# Patient Record
Sex: Female | Born: 1965 | Race: White | Hispanic: No | Marital: Married
Health system: Southern US, Community
[De-identification: ages and names within clinical notes are randomized; demographics above are authoritative.]

---

## 2006-04-21 ENCOUNTER — Ambulatory Visit (HOSPITAL_COMMUNITY): Admission: RE | Admit: 2006-04-21 | Discharge: 2006-04-21 | Payer: Self-pay | Admitting: Orthopedic Surgery

## 2008-02-07 ENCOUNTER — Encounter: Admission: RE | Admit: 2008-02-07 | Discharge: 2008-02-07 | Payer: Self-pay | Admitting: Obstetrics and Gynecology

## 2010-05-25 ENCOUNTER — Other Ambulatory Visit: Payer: Self-pay | Admitting: Obstetrics and Gynecology

## 2010-05-25 DIAGNOSIS — Z1231 Encounter for screening mammogram for malignant neoplasm of breast: Secondary | ICD-10-CM

## 2010-06-09 ENCOUNTER — Ambulatory Visit
Admission: RE | Admit: 2010-06-09 | Discharge: 2010-06-09 | Disposition: A | Discharge status: Home / Self Care | Payer: BC Managed Care – PPO | Source: Ambulatory Visit | Attending: Obstetrics and Gynecology | Admitting: Obstetrics and Gynecology

## 2010-06-09 DIAGNOSIS — Z1231 Encounter for screening mammogram for malignant neoplasm of breast: Secondary | ICD-10-CM

## 2011-11-16 ENCOUNTER — Other Ambulatory Visit: Payer: Self-pay | Admitting: Unknown Physician Specialty

## 2011-11-16 DIAGNOSIS — Z1231 Encounter for screening mammogram for malignant neoplasm of breast: Secondary | ICD-10-CM

## 2011-11-17 ENCOUNTER — Ambulatory Visit
Admission: RE | Admit: 2011-11-17 | Discharge: 2011-11-17 | Disposition: A | Discharge status: Home / Self Care | Payer: BC Managed Care – PPO | Source: Ambulatory Visit | Attending: Unknown Physician Specialty | Admitting: Unknown Physician Specialty

## 2011-11-17 DIAGNOSIS — Z1231 Encounter for screening mammogram for malignant neoplasm of breast: Secondary | ICD-10-CM

## 2013-02-12 ENCOUNTER — Other Ambulatory Visit: Payer: Self-pay

## 2013-02-12 DIAGNOSIS — Z1231 Encounter for screening mammogram for malignant neoplasm of breast: Secondary | ICD-10-CM

## 2013-03-05 ENCOUNTER — Ambulatory Visit: Payer: BC Managed Care – PPO

## 2013-03-07 ENCOUNTER — Ambulatory Visit
Admission: RE | Admit: 2013-03-07 | Discharge: 2013-03-07 | Disposition: A | Discharge status: Home / Self Care | Payer: BC Managed Care – PPO | Source: Ambulatory Visit

## 2013-03-07 DIAGNOSIS — Z1231 Encounter for screening mammogram for malignant neoplasm of breast: Secondary | ICD-10-CM

## 2013-03-11 ENCOUNTER — Other Ambulatory Visit: Payer: Self-pay | Admitting: Unknown Physician Specialty

## 2013-03-11 DIAGNOSIS — R928 Other abnormal and inconclusive findings on diagnostic imaging of breast: Secondary | ICD-10-CM

## 2013-03-18 ENCOUNTER — Ambulatory Visit
Admission: RE | Admit: 2013-03-18 | Discharge: 2013-03-18 | Disposition: A | Discharge status: Home / Self Care | Payer: BC Managed Care – PPO | Source: Ambulatory Visit | Attending: Unknown Physician Specialty | Admitting: Unknown Physician Specialty

## 2013-03-18 DIAGNOSIS — R928 Other abnormal and inconclusive findings on diagnostic imaging of breast: Secondary | ICD-10-CM

## 2014-04-25 ENCOUNTER — Other Ambulatory Visit: Payer: Self-pay

## 2014-04-25 DIAGNOSIS — Z1231 Encounter for screening mammogram for malignant neoplasm of breast: Secondary | ICD-10-CM

## 2014-05-16 ENCOUNTER — Ambulatory Visit
Admission: RE | Admit: 2014-05-16 | Discharge: 2014-05-16 | Disposition: A | Discharge status: Home / Self Care | Payer: BC Managed Care – PPO | Source: Ambulatory Visit

## 2014-05-16 ENCOUNTER — Encounter (INDEPENDENT_AMBULATORY_CARE_PROVIDER_SITE_OTHER): Payer: Self-pay

## 2014-05-16 DIAGNOSIS — N644 Mastodynia: Secondary | ICD-10-CM

## 2014-05-16 DIAGNOSIS — Z1231 Encounter for screening mammogram for malignant neoplasm of breast: Secondary | ICD-10-CM

## 2014-05-20 ENCOUNTER — Other Ambulatory Visit: Payer: Self-pay | Admitting: Unknown Physician Specialty

## 2014-05-20 ENCOUNTER — Other Ambulatory Visit: Payer: Self-pay

## 2014-05-20 DIAGNOSIS — N644 Mastodynia: Secondary | ICD-10-CM

## 2014-05-21 ENCOUNTER — Ambulatory Visit
Admission: RE | Admit: 2014-05-21 | Discharge: 2014-05-21 | Disposition: A | Discharge status: Home / Self Care | Payer: BC Managed Care – PPO | Source: Ambulatory Visit | Attending: Unknown Physician Specialty | Admitting: Unknown Physician Specialty

## 2014-05-21 DIAGNOSIS — N644 Mastodynia: Secondary | ICD-10-CM

## 2015-07-16 ENCOUNTER — Other Ambulatory Visit: Payer: Self-pay

## 2015-07-16 DIAGNOSIS — Z1231 Encounter for screening mammogram for malignant neoplasm of breast: Secondary | ICD-10-CM

## 2015-08-11 ENCOUNTER — Ambulatory Visit
Admission: RE | Admit: 2015-08-11 | Discharge: 2015-08-11 | Disposition: A | Discharge status: Home / Self Care | Payer: BC Managed Care – PPO | Source: Ambulatory Visit

## 2015-08-11 DIAGNOSIS — Z1231 Encounter for screening mammogram for malignant neoplasm of breast: Secondary | ICD-10-CM

## 2016-10-18 ENCOUNTER — Other Ambulatory Visit: Payer: Self-pay | Admitting: Unknown Physician Specialty

## 2016-10-18 DIAGNOSIS — Z1231 Encounter for screening mammogram for malignant neoplasm of breast: Secondary | ICD-10-CM

## 2016-10-27 ENCOUNTER — Ambulatory Visit
Admission: RE | Admit: 2016-10-27 | Discharge: 2016-10-27 | Disposition: A | Discharge status: Home / Self Care | Payer: BC Managed Care – PPO | Source: Ambulatory Visit | Attending: Unknown Physician Specialty | Admitting: Unknown Physician Specialty

## 2016-10-27 DIAGNOSIS — Z1231 Encounter for screening mammogram for malignant neoplasm of breast: Secondary | ICD-10-CM

## 2016-11-01 ENCOUNTER — Other Ambulatory Visit: Payer: Self-pay | Admitting: Neurosurgery

## 2016-11-01 DIAGNOSIS — M47816 Spondylosis without myelopathy or radiculopathy, lumbar region: Secondary | ICD-10-CM

## 2016-11-08 ENCOUNTER — Ambulatory Visit
Admission: RE | Admit: 2016-11-08 | Discharge: 2016-11-08 | Disposition: A | Discharge status: Home / Self Care | Payer: BC Managed Care – PPO | Source: Ambulatory Visit | Attending: Neurosurgery | Admitting: Neurosurgery

## 2016-11-08 DIAGNOSIS — M47816 Spondylosis without myelopathy or radiculopathy, lumbar region: Secondary | ICD-10-CM

## 2016-11-08 MED ORDER — IOPAMIDOL (ISOVUE-M 200) INJECTION 41%
1.0000 mL | Freq: Once | INTRAMUSCULAR | Status: AC
  Administered 2016-11-08: 1 mL via EPIDURAL

## 2016-11-08 MED ORDER — METHYLPREDNISOLONE ACETATE 40 MG/ML INJ SUSP (RADIOLOG
120.0000 mg | Freq: Once | INTRAMUSCULAR | Status: AC
  Administered 2016-11-08: 120 mg via EPIDURAL

## 2016-11-08 NOTE — Discharge Instructions (Signed)

## 2017-10-20 ENCOUNTER — Other Ambulatory Visit: Payer: Self-pay | Admitting: Unknown Physician Specialty

## 2017-10-20 DIAGNOSIS — Z1231 Encounter for screening mammogram for malignant neoplasm of breast: Secondary | ICD-10-CM

## 2017-11-16 ENCOUNTER — Ambulatory Visit
Admission: RE | Admit: 2017-11-16 | Discharge: 2017-11-16 | Disposition: A | Discharge status: Home / Self Care | Payer: BC Managed Care – PPO | Source: Ambulatory Visit | Attending: Unknown Physician Specialty | Admitting: Unknown Physician Specialty

## 2017-11-16 DIAGNOSIS — Z1231 Encounter for screening mammogram for malignant neoplasm of breast: Secondary | ICD-10-CM

## 2018-10-16 ENCOUNTER — Other Ambulatory Visit: Payer: Self-pay | Admitting: Unknown Physician Specialty

## 2018-10-16 DIAGNOSIS — Z1231 Encounter for screening mammogram for malignant neoplasm of breast: Secondary | ICD-10-CM

## 2018-11-29 ENCOUNTER — Other Ambulatory Visit: Payer: Self-pay

## 2018-11-29 ENCOUNTER — Ambulatory Visit
Admission: RE | Admit: 2018-11-29 | Discharge: 2018-11-29 | Disposition: A | Discharge status: Home / Self Care | Payer: BC Managed Care – PPO | Source: Ambulatory Visit | Attending: Unknown Physician Specialty | Admitting: Unknown Physician Specialty

## 2018-11-29 DIAGNOSIS — Z1231 Encounter for screening mammogram for malignant neoplasm of breast: Secondary | ICD-10-CM

## 2021-01-17 IMAGING — MG DIGITAL SCREENING BILATERAL MAMMOGRAM WITH TOMO AND CAD
8 series · 9 of 24 positions shown · non-contrast
Comparison: Previous exam(s).

CLINICAL DATA: Screening.

EXAM:
DIGITAL SCREENING BILATERAL MAMMOGRAM WITH TOMO AND CAD

[L MLO synth-2D]
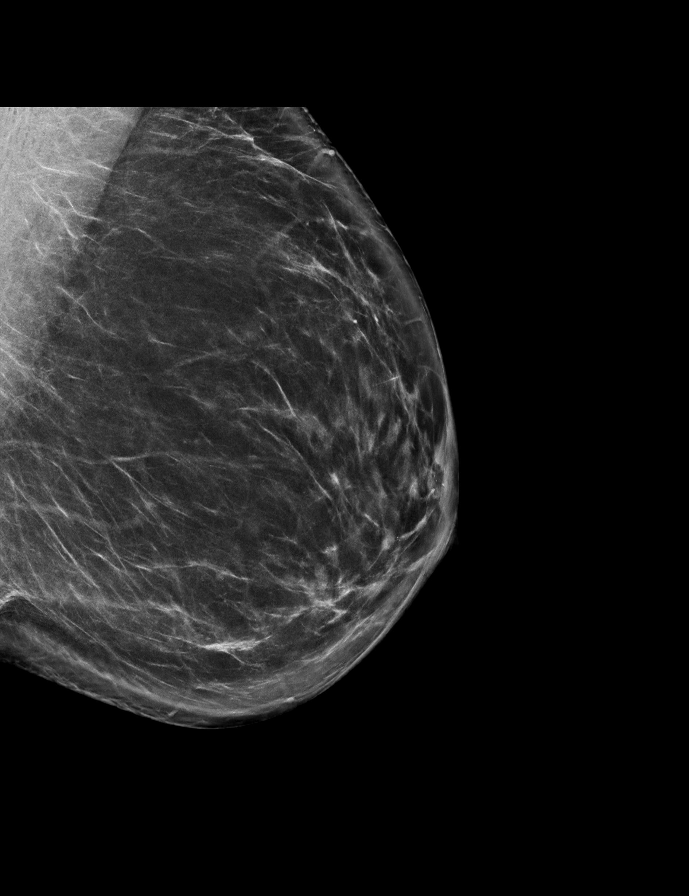

[R CC synth-2D]
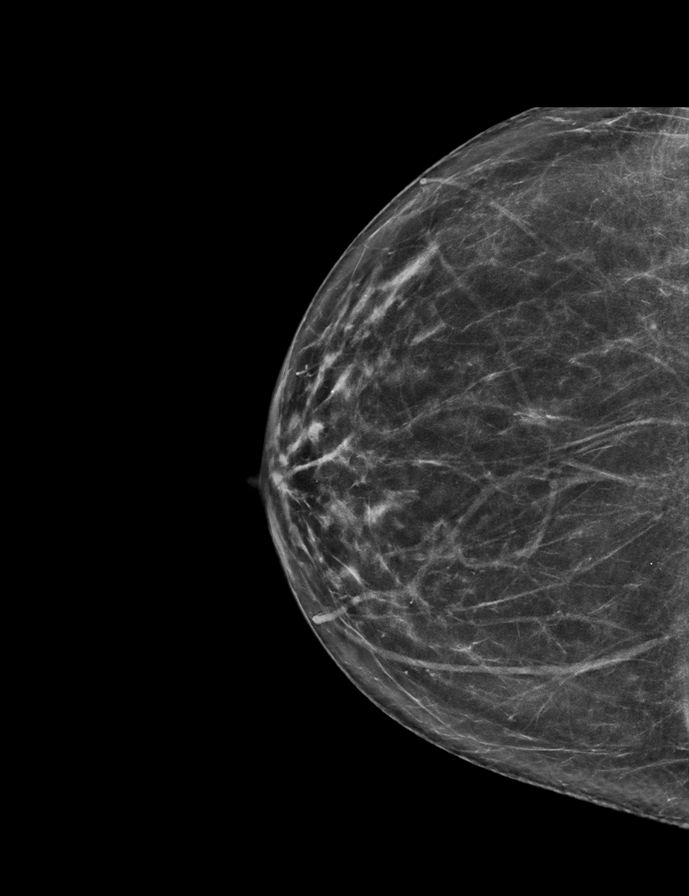

[L CC synth-2D]
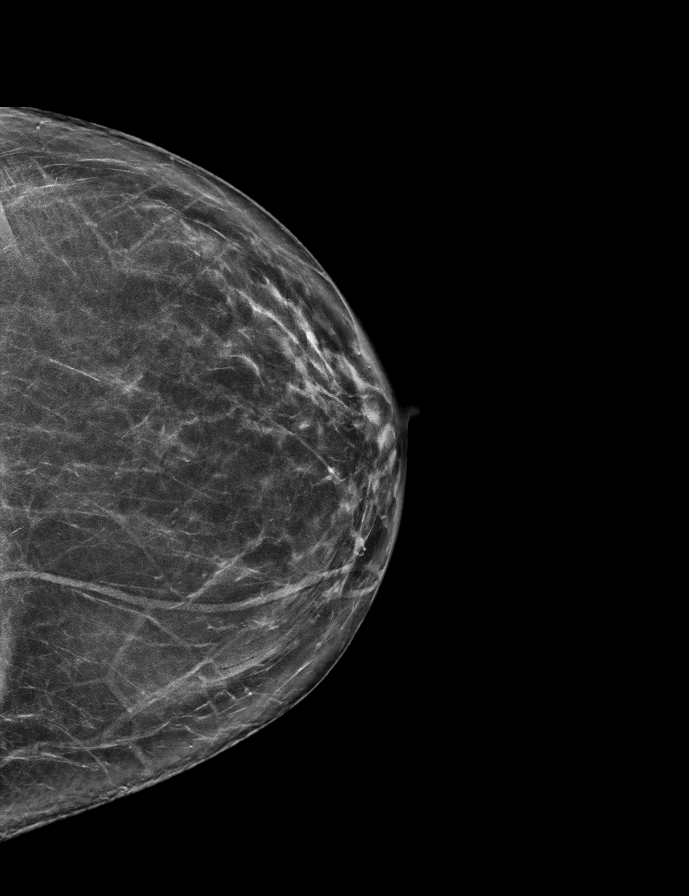

[R MLO synth-2D]
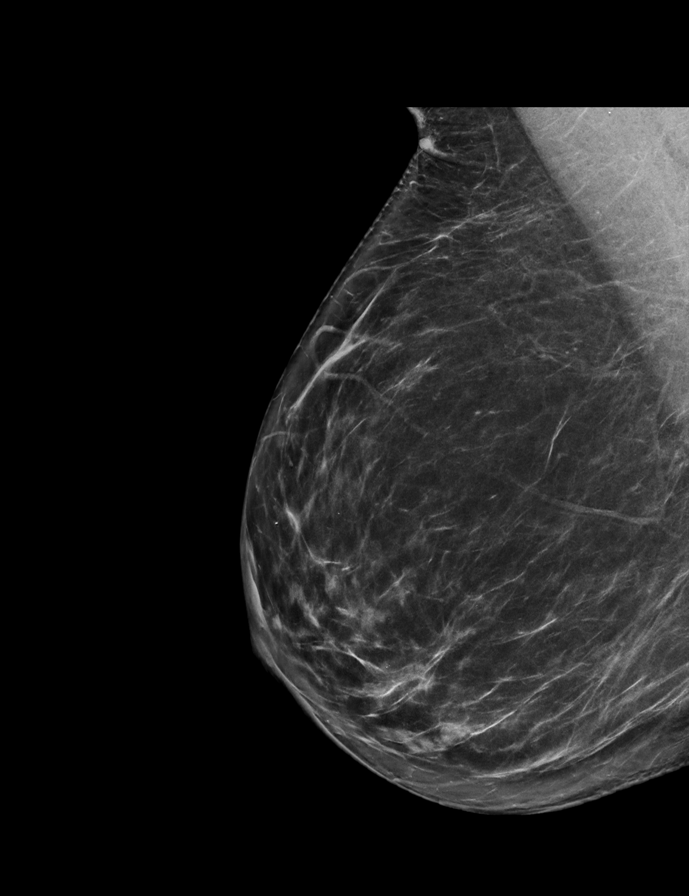

[L MLO tomo · 2 of 80 frames shown]
[frame 26/80]
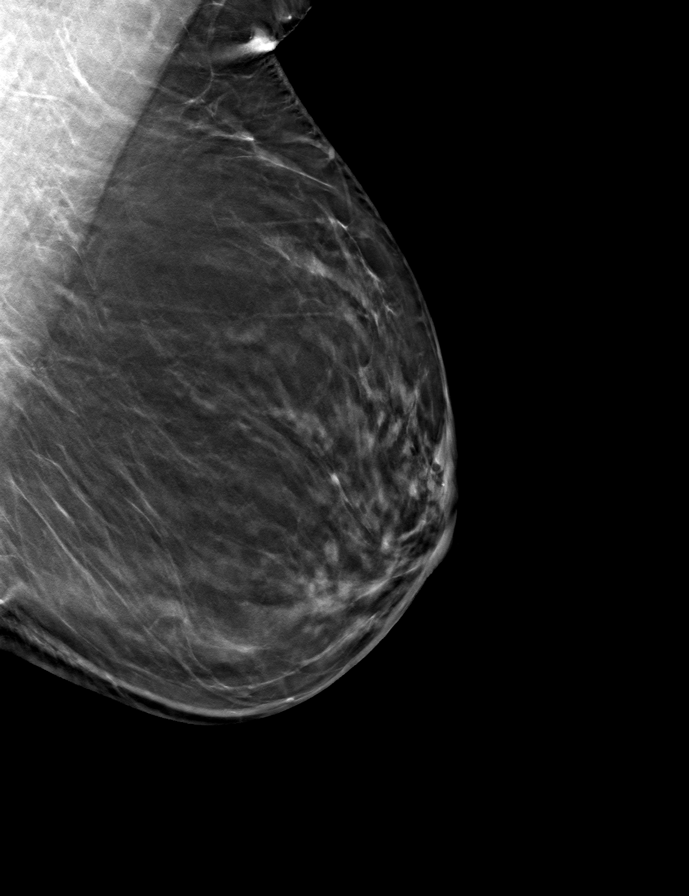
[frame 41/80]
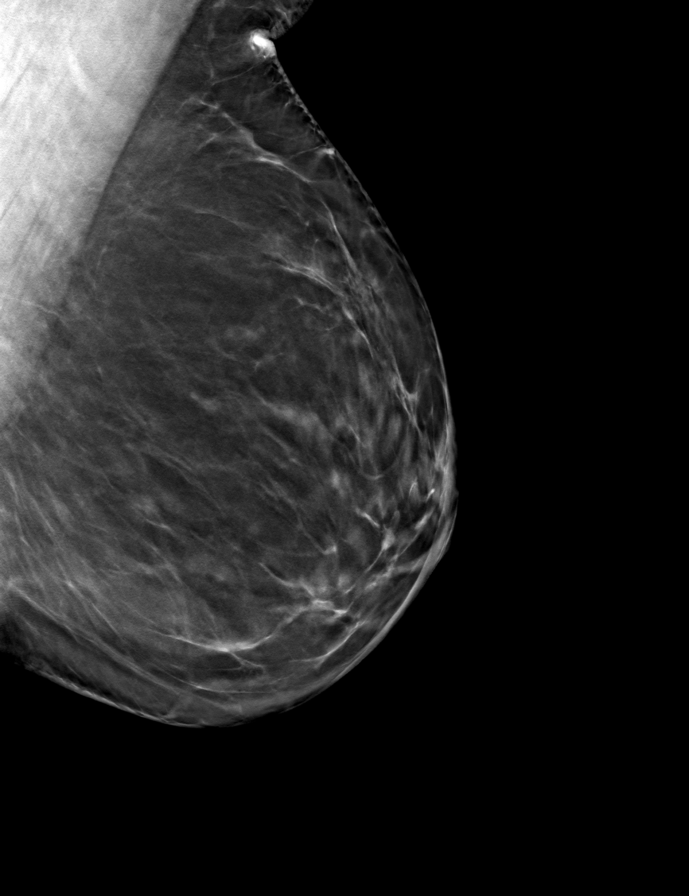

[R MLO tomo · tomo slice 39/76.0]
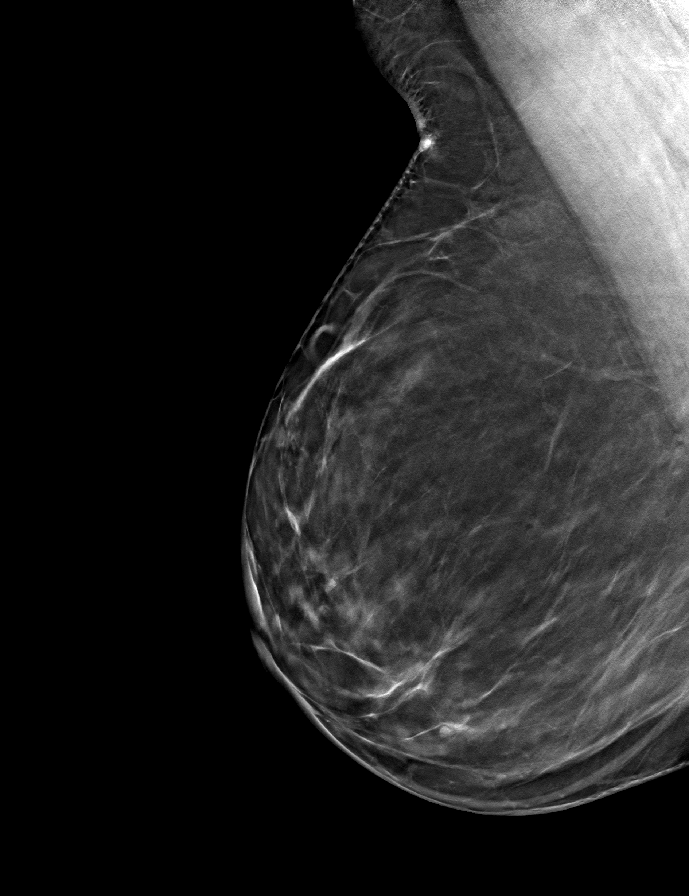

[R CC tomo · tomo slice 35/68.0]
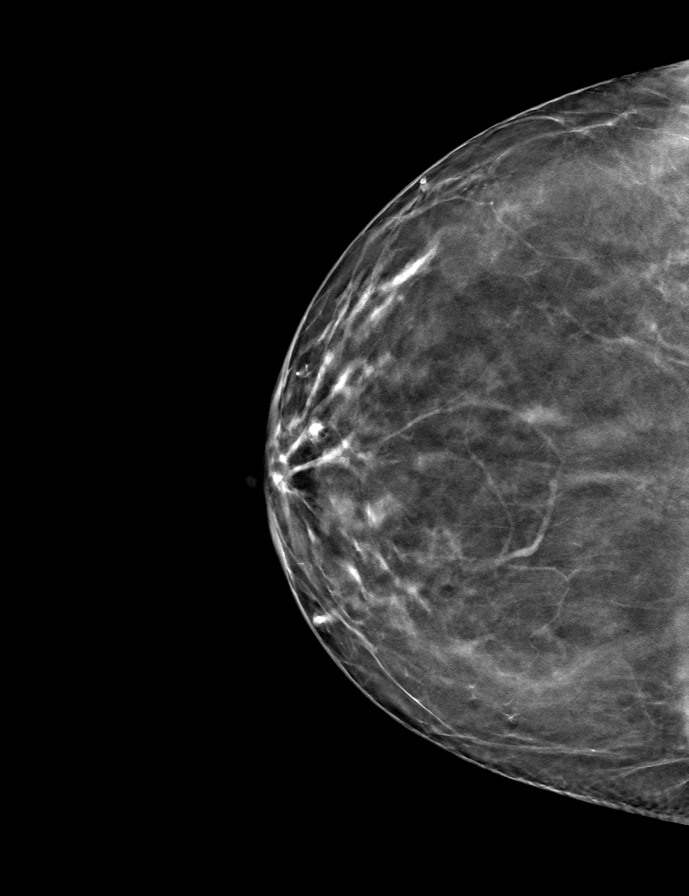

[L CC tomo · tomo slice 37/74.0]
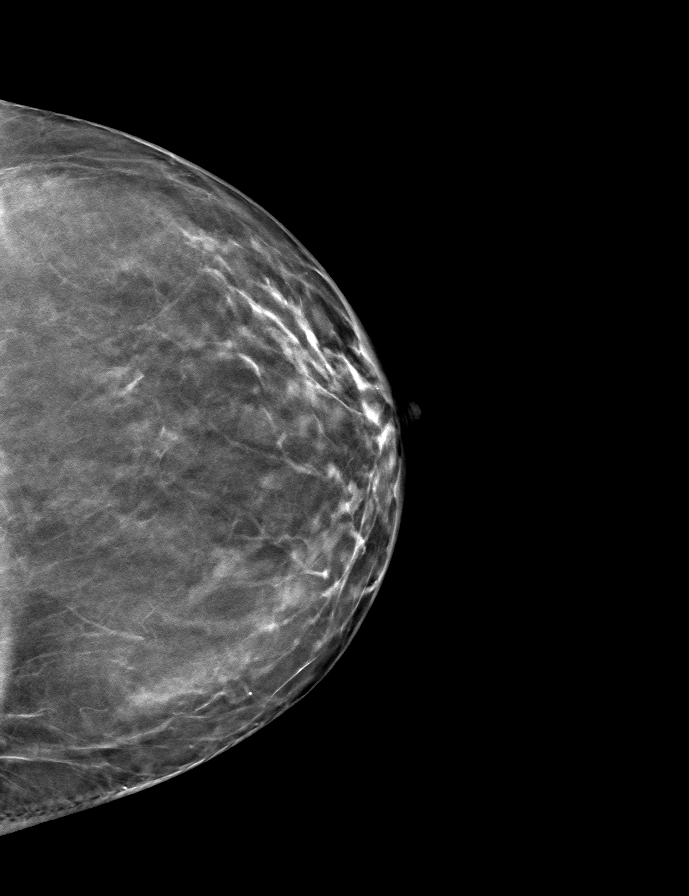

[9 of 24 positions shown; findings below may reference images not displayed]

ACR Breast Density Category b: There are scattered areas of
fibroglandular density.
FINDINGS: There are no findings suspicious for malignancy. Images were
processed with CAD.
IMPRESSION: No mammographic evidence of malignancy. A result letter of this
screening mammogram will be mailed directly to the patient.

RECOMMENDATION:
Screening mammogram in one year. (Code:[id number])

BI-RADS CATEGORY  1: Negative.
